# Patient Record
Sex: Female | Born: 2000 | Hispanic: Yes | Marital: Single | Smoking: Never smoker
Health system: Southern US, Community
[De-identification: ages and names within clinical notes are randomized; demographics above are authoritative.]

## PROBLEM LIST (undated history)

## (undated) DIAGNOSIS — N915 Oligomenorrhea, unspecified: Secondary | ICD-10-CM

## (undated) HISTORY — PX: NO PAST SURGERIES: SHX2092

## (undated) HISTORY — DX: Oligomenorrhea, unspecified: N91.5

---
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
## 2019-06-02 ENCOUNTER — Ambulatory Visit: Payer: Self-pay | Attending: Internal Medicine

## 2019-06-02 DIAGNOSIS — Z23 Encounter for immunization: Secondary | ICD-10-CM

## 2019-06-02 NOTE — Progress Notes (Signed)
   Covid-19 Vaccination Clinic  Name:  Nykeria Mealing    MRN: [id number] DOB: 2000/08/19  06/02/2019  Ms. Jnya Brossard was observed post Covid-19 immunization for 15 minutes without incident. She was provided with Vaccine Information Sheet and instruction to access the V-Safe system.   Ms. Malinda Mayden was instructed to call 911 with any severe reactions post vaccine: Marland Kitchen Difficulty breathing  . Swelling of face and throat  . A fast heartbeat  . A bad rash all over body  . Dizziness and weakness   Immunizations Administered    Name Date Dose VIS Date Route   Pfizer COVID-19 Vaccine 06/02/2019  6:54 PM 0.3 mL 02/15/2019 Intramuscular   Manufacturer: [employer], [employer]   Lot: [id number]   NDC: [address]-2

## 2019-06-23 ENCOUNTER — Ambulatory Visit: Payer: Self-pay | Attending: Internal Medicine

## 2019-06-23 DIAGNOSIS — Z23 Encounter for immunization: Secondary | ICD-10-CM

## 2019-06-23 NOTE — Progress Notes (Signed)
   Covid-19 Vaccination Clinic  Name:  Kristine Stanley    MRN: [id number] DOB: 2000/05/03  06/23/2019  Ms. Kristine Stanley was observed post Covid-19 immunization for 15 minutes without incident. She was provided with Vaccine Information Sheet and instruction to access the V-Safe system.   Ms. Kristine Stanley was instructed to call 911 with any severe reactions post vaccine: Marland Kitchen Difficulty breathing  . Swelling of face and throat  . A fast heartbeat  . A bad rash all over body  . Dizziness and weakness   Immunizations Administered    Name Date Dose VIS Date Route   Pfizer COVID-19 Vaccine 06/23/2019  5:56 PM 0.3 mL 02/15/2019 Intramuscular   Manufacturer: [employer], [employer]   Lot: [id number]   NDC: [address]-8

## 2019-07-11 ENCOUNTER — Emergency Department
Admission: EM | Admit: 2019-07-11 | Discharge: 2019-07-11 | Disposition: A | Discharge status: Home / Self Care | Payer: No Typology Code available for payment source | Attending: Emergency Medicine | Admitting: Emergency Medicine

## 2019-07-11 ENCOUNTER — Other Ambulatory Visit: Payer: Self-pay

## 2019-07-11 ENCOUNTER — Emergency Department: Payer: No Typology Code available for payment source

## 2019-07-11 DIAGNOSIS — G44201 Tension-type headache, unspecified, intractable: Secondary | ICD-10-CM | POA: Diagnosis not present

## 2019-07-11 DIAGNOSIS — Y999 Unspecified external cause status: Secondary | ICD-10-CM | POA: Insufficient documentation

## 2019-07-11 DIAGNOSIS — S161XXA Strain of muscle, fascia and tendon at neck level, initial encounter: Secondary | ICD-10-CM

## 2019-07-11 DIAGNOSIS — S199XXA Unspecified injury of neck, initial encounter: Secondary | ICD-10-CM | POA: Diagnosis present

## 2019-07-11 DIAGNOSIS — Y93I9 Activity, other involving external motion: Secondary | ICD-10-CM | POA: Diagnosis not present

## 2019-07-11 DIAGNOSIS — Y92410 Unspecified street and highway as the place of occurrence of the external cause: Secondary | ICD-10-CM | POA: Insufficient documentation

## 2019-07-11 MED ORDER — BUTALBITAL-APAP-CAFFEINE 50-325-40 MG PO TABS
2.0000 | ORAL_TABLET | Freq: Once | ORAL | Status: AC
  Administered 2019-07-11: 19:00:00 2 via ORAL
  Filled 2019-07-11: qty 2

## 2019-07-11 MED ORDER — CYCLOBENZAPRINE HCL 5 MG PO TABS
5.0000 mg | ORAL_TABLET | Freq: Three times a day (TID) | ORAL | 0 refills | Status: DC | PRN
Start: 2019-07-11 — End: 2023-04-04

## 2019-07-11 MED ORDER — NAPROXEN 500 MG PO TABS: 500.0000 mg | ORAL_TABLET | Freq: Two times a day (BID) | ORAL | 0 refills | Status: DC
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
## 2019-07-11 NOTE — Discharge Instructions (Signed)
Please follow-up with your primary care provider for symptoms that are not improving over the next week or so.  Return to the emergency department for symptoms of change or worsen if you are unable to schedule an appointment.
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
## 2023-03-06 ENCOUNTER — Ambulatory Visit: Payer: 59 | Admitting: Family Medicine

## 2023-04-03 ENCOUNTER — Ambulatory Visit: Payer: 59 | Admitting: Family Medicine

## 2023-04-04 ENCOUNTER — Encounter: Payer: Self-pay | Admitting: Family Medicine

## 2023-04-04 ENCOUNTER — Ambulatory Visit (INDEPENDENT_AMBULATORY_CARE_PROVIDER_SITE_OTHER): Payer: 59 | Admitting: Family Medicine

## 2023-04-04 VITALS — BP 100/65 | HR 64 | Ht 60.0 in | Wt 143.0 lb

## 2023-04-04 DIAGNOSIS — Z13220 Encounter for screening for lipoid disorders: Secondary | ICD-10-CM

## 2023-04-04 DIAGNOSIS — Z131 Encounter for screening for diabetes mellitus: Secondary | ICD-10-CM

## 2023-04-04 DIAGNOSIS — N913 Primary oligomenorrhea: Secondary | ICD-10-CM | POA: Insufficient documentation

## 2023-04-04 DIAGNOSIS — E663 Overweight: Secondary | ICD-10-CM

## 2023-04-04 DIAGNOSIS — Z130 Encounter for screening for diseases of the blood and blood-forming organs and certain disorders involving the immune mechanism: Secondary | ICD-10-CM | POA: Diagnosis not present

## 2023-04-04 DIAGNOSIS — Z7689 Persons encountering health services in other specified circumstances: Secondary | ICD-10-CM

## 2023-04-04 DIAGNOSIS — Z6827 Body mass index (BMI) 27.0-27.9, adult: Secondary | ICD-10-CM | POA: Insufficient documentation

## 2023-04-04 NOTE — Assessment & Plan Note (Signed)
Possible PCOS based on irregular menstrual cycles and history of ovarian cysts. Symptoms and treatment align with PCOS management. Discussed weight management and exercise. Explained metformin's role in addressing insulin resistance common in PCOS. - Continue metformin 500 mg daily with dinner - Continue Provera 10 mg once daily - Continue letrozole on days 3-7 of the menstrual cycle - Order CBC, thyroid panel, metabolic panel, A1c, testosterone, FSH, LH, and prolactin - f/u with GYN as possible

## 2023-04-04 NOTE — Assessment & Plan Note (Signed)
BMI 27.93 A1c, lipid panel, CMP

## 2023-04-04 NOTE — Progress Notes (Signed)
New patient visit    Patient: Kristine Stanley   DOB: Jan 31, 2001   23 y.o. Female  MRN: [id number] Visit Date: 04/04/2023  Today's healthcare provider: Ronnald Ramp, MD   Chief Complaint  Patient presents with   New Patient (Initial Visit)   Subjective    Kristine Stanley is a 23 y.o. female who presents today as a new patient to establish care.    Discussed the use of AI scribe software for clinical note transcription with the patient, who gave verbal consent to proceed.  History of Present Illness   The patient presents with concerns about possible PCOS and irregular menstruation.  She has never had regular menstrual periods and was informed by her OBGYN that she may have polycystic ovary syndrome (PCOS). Menstruation began at a very young age, around 23 years old, with ovarian cysts present at that time. She has never menstruated spontaneously and requires medication to induce her periods.  Currently, she is taking 500 mg of metformin daily with dinner to address potential insulin resistance associated with PCOS. Additionally, she is on Provera to stimulate menstruation and takes letrozole from days three to seven of her cycle. This is her first month on letrozole.  She has been trying to conceive since May 2024 without success and is concerned about her irregular periods and the possibility of PCOS affecting her fertility.  She has not had a primary care provider for a couple of years and is seeking to establish care. She has not had a physical examination in a while and is interested in having basic labs done to check her overall health, including thyroid function and anemia, as she experiences cold intolerance. Previous thyroid function tests from June 2024 showed normal TSH and T4 levels. Her last A1c was normal at 5.3.  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 She exercises by walking a mile most days of the  week and is trying to lose weight, as she is aware of the potential link between PCOS and insulin resistance.  No history of asthma, blood clots, migraines, or thyroid issues, although there was a consideration of hypothyroidism due to her cold intolerance.      Past Medical History:  Diagnosis Date   Oligomenorrhea    Off BC    Outpatient Medications Prior to Visit  Medication Sig   letrozole (FEMARA) 2.5 MG tablet Take by mouth.   medroxyPROGESTERone (PROVERA) 10 MG tablet Take by mouth.   metFORMIN (GLUCOPHAGE-XR) 500 MG 24 hr tablet Take 500 mg by mouth daily.   [DISCONTINUED] cyclobenzaprine (FLEXERIL) 5 MG tablet Take 1 tablet (5 mg total) by mouth 3 (three) times daily as needed for muscle spasms.   [DISCONTINUED] naproxen (NAPROSYN) 500 MG tablet Take 1 tablet (500 mg total) by mouth 2 (two) times daily with a meal.   [DISCONTINUED] norelgestromin-ethinyl estradiol [employer]) 150-35 MCG/24HR transdermal patch Place 1 patch onto the skin once a week. Apply 1 patch weekly for 3 weeks, then 1 week without patch   Facility-Administered Medications Prior to Visit  Medication Dose Route Frequency Provider   betamethasone acetate-betamethasone sodium phosphate (CELESTONE) injection 3 mg  3 mg Intramuscular Once Gala Lewandowsky M, DPM   betamethasone acetate-betamethasone sodium phosphate (CELESTONE) injection 3 mg  3 mg Intramuscular Once Felecia Shelling, DPM    Past Surgical History:  Procedure Laterality Date   NO PAST SURGERIES     Family Status  Relation Name  Status   Mother  Alive   Father  Alive  No partnership data on file   Family History  Problem Relation Age of Onset   Healthy Mother    Healthy Father    Social History   Socioeconomic History   Marital status: Single    Spouse name: Not on file   Number of children: Not on file   Years of education: Not on file   Highest education level: Not on file  Occupational History   Not on file  Tobacco Use   Smoking  status: Never   Smokeless tobacco: Never  Substance and Sexual Activity   Alcohol use: Yes    Comment: social   Drug use: No   Sexual activity: Yes    Birth control/protection: Condom  Other Topics Concern   Not on file  Social History Narrative   Not on file   Social Drivers of Health   Financial Resource Strain: Not on file  Food Insecurity: Not on file  Transportation Needs: Not on file  Physical Activity: Not on file  Stress: Not on file  Social Connections: Not on file     No Known Allergies  Immunization History  Administered Date(s) Administered   Influenza-Unspecified 12/15/2022   PFIZER(Purple Top)SARS-COV-2 Vaccination 06/02/2019, 06/23/2019    Health Maintenance  Topic Date Due   HPV VACCINES (1 - 3-dose series) Never done   HIV Screening  Never done   Hepatitis C Screening  Never done   CHLAMYDIA SCREENING  05/30/2017   DTaP/Tdap/Td (1 - Tdap) Never done   COVID-19 Vaccine (3 - 2024-25 season) 11/06/2022   Cervical Cancer Screening (Pap smear)  06/09/2025   INFLUENZA VACCINE  Completed    Patient Care Team: Ronnald Ramp, MD as PCP - General (Family Medicine)  Review of Systems  Last CBC No results found for: "WBC", "HGB", "HCT", "MCV", "MCH", "RDW", "PLT" Last metabolic panel No results found for: "GLUCOSE", "NA", "K", "CL", "CO2", "BUN", "CREATININE", "EGFR", "CALCIUM", "PHOS", "PROT", "ALBUMIN", "LABGLOB", "AGRATIO", "BILITOT", "ALKPHOS", "AST", "ALT", "ANIONGAP" Last lipids No results found for: "CHOL", "HDL", "LDLCALC", "LDLDIRECT", "TRIG", "CHOLHDL" Last hemoglobin A1c No results found for: "HGBA1C" Last thyroid functions No results found for: "TSH", "T3TOTAL", "T4TOTAL", "THYROIDAB"      Objective    BP 100/65 (BP Location: Left Arm, Patient Position: Sitting, Cuff Size: Normal)   Pulse 64   Ht 5' (1.524 m)   Wt 143 lb (64.9 kg)   LMP 08/25/2022   SpO2 97%   BMI 27.93 kg/m  BP Readings from Last 3 Encounters:   04/04/23 100/65  07/11/19 127/89  01/26/17 119/64   Wt Readings from Last 3 Encounters:  04/04/23 143 lb (64.9 kg)  07/11/19 135 lb (61.2 kg)  01/26/17 135 lb (61.2 kg) (67%, Z= 0.45)*   * Growth percentiles are based on CDC (Girls, 2-20 Years) data.        Depression Screen    04/04/2023    9:39 AM  PHQ 2/9 Scores  PHQ - 2 Score 0  PHQ- 9 Score 0   No results found for any visits on 04/04/23.   Physical Exam Vitals reviewed.  Constitutional:      General: She is not in acute distress.    Appearance: Normal appearance. She is not ill-appearing, toxic-appearing or diaphoretic.  Eyes:     Conjunctiva/sclera: Conjunctivae normal.  Neck:     Thyroid: No thyroid mass, thyromegaly or thyroid tenderness.  Cardiovascular:     Rate and  Rhythm: Normal rate and regular rhythm.     Pulses: Normal pulses.     Heart sounds: Normal heart sounds. No murmur heard.    No friction rub. No gallop.  Pulmonary:     Effort: Pulmonary effort is normal. No respiratory distress.     Breath sounds: Normal breath sounds. No stridor. No wheezing, rhonchi or rales.  Abdominal:     General: Bowel sounds are normal. There is no distension.     Palpations: Abdomen is soft.     Tenderness: There is no abdominal tenderness.  Musculoskeletal:     Right lower leg: No edema.     Left lower leg: No edema.  Skin:    Findings: No erythema or rash.  Neurological:     Mental Status: She is alert and oriented to person, place, and time.        Assessment & Plan      Problem List Items Addressed This Visit       Other   Primary oligomenorrhea - Primary   Possible PCOS based on irregular menstrual cycles and history of ovarian cysts. Symptoms and treatment align with PCOS management. Discussed weight management and exercise. Explained metformin's role in addressing insulin resistance common in PCOS. - Continue metformin 500 mg daily with dinner - Continue Provera 10 mg once daily - Continue  letrozole on days 3-7 of the menstrual cycle - Order CBC, thyroid panel, metabolic panel, A1c, testosterone, FSH, LH, and prolactin - f/u with GYN as possible       Relevant Orders   Testosterone,Free and Total   FSH/LH   Prolactin   Overweight with body mass index (BMI) of 27 to 27.9 in adult   BMI 27.93 A1c, lipid panel, CMP       Relevant Orders   Hemoglobin A1c   CMP14+EGFR   TSH+T4F+T3Free   Lipid panel   Other Visit Diagnoses       Establishing care with new doctor, encounter for         Screening for deficiency anemia       Relevant Orders   CBC     Screening for lipid disorders         Screening for diabetes mellitus          General Health Maintenance Has not had a physical exam in several years and has not established care with a primary care provider. Discussed the importance of annual physical exams, routine screenings, and vaccinations. Encouraged taking a prenatal vitamin to support conception efforts. - Schedule annual physical exam for end of June - Discuss and update vaccinations and screenings during physical exam - Encourage taking a prenatal vitamin - Use MyChart for communication and results  Follow-up - Schedule follow-up appointment for physical exam in end of June - Review lab results and communicate via MyChart - Follow up with OBGYN in a couple of months.       Return in about 5 months (around 09/02/2023) for CPE.      Ronnald Ramp, MD  [facility] [phone] (phone) [phone] (fax)  [facility]

## 2023-04-05 ENCOUNTER — Encounter: Payer: Self-pay | Admitting: Family Medicine

## 2023-04-06 LAB — CMP14+EGFR
ALT: 14 [IU]/L (ref 0–32)
AST: 18 [IU]/L (ref 0–40)
Albumin: 4.5 g/dL (ref 4.0–5.0)
Alkaline Phosphatase: 109 [IU]/L (ref 44–121)
BUN/Creatinine Ratio: 19 (ref 9–23)
BUN: 10 mg/dL (ref 6–20)
Bilirubin Total: 0.5 mg/dL (ref 0.0–1.2)
CO2: 24 mmol/L (ref 20–29)
Calcium: 9.5 mg/dL (ref 8.7–10.2)
Chloride: 101 mmol/L (ref 96–106)
Creatinine, Ser: 0.53 mg/dL — ABNORMAL LOW (ref 0.57–1.00)
Globulin, Total: 2.8 g/dL (ref 1.5–4.5)
Glucose: 95 mg/dL (ref 70–99)
Potassium: 4.2 mmol/L (ref 3.5–5.2)
Sodium: 139 mmol/L (ref 134–144)
Total Protein: 7.3 g/dL (ref 6.0–8.5)
eGFR: 132 mL/min/{1.73_m2} (ref >59)

## 2023-04-06 LAB — CBC
Hematocrit: 39.1 % (ref 34.0–46.6)
Hemoglobin: 12.8 g/dL (ref 11.1–15.9)
MCH: 27.1 pg (ref 26.6–33.0)
MCHC: 32.7 g/dL (ref 31.5–35.7)
MCV: 83 fL (ref 79–97)
Platelets: 272 10*3/uL (ref 150–450)
RBC: 4.73 x10E6/uL (ref 3.77–5.28)
RDW: 13.5 % (ref 11.7–15.4)
WBC: 7.6 10*3/uL (ref 3.4–10.8)

## 2023-04-06 LAB — TESTOSTERONE,FREE AND TOTAL
Testosterone, Free: 2.9 pg/mL (ref 0.0–4.2)
Testosterone: 52 ng/dL (ref 13–71)

## 2023-04-06 LAB — LIPID PANEL
Chol/HDL Ratio: 3.1 {ratio} (ref 0.0–4.4)
Cholesterol, Total: 162 mg/dL (ref 100–199)
HDL: 52 mg/dL (ref >39)
LDL Chol Calc (NIH): 96 mg/dL (ref 0–99)
Triglycerides: 74 mg/dL (ref 0–149)
VLDL Cholesterol Cal: 14 mg/dL (ref 5–40)

## 2023-04-06 LAB — HEMOGLOBIN A1C
Est. average glucose Bld gHb Est-mCnc: 108 mg/dL
Hgb A1c MFr Bld: 5.4 % (ref 4.8–5.6)

## 2023-04-06 LAB — TSH+T4F+T3FREE
Free T4: 1.37 ng/dL (ref 0.82–1.77)
T3, Free: 3.6 pg/mL (ref 2.0–4.4)
TSH: 0.762 u[IU]/mL (ref 0.450–4.500)

## 2023-04-06 LAB — FSH/LH
FSH: 4.5 m[IU]/mL
LH: 6.5 m[IU]/mL

## 2023-04-06 LAB — PROLACTIN: Prolactin: 8.9 ng/mL (ref 4.8–33.4)

## 2023-04-28 ENCOUNTER — Ambulatory Visit: Payer: 59 | Admitting: Family Medicine

## 2023-04-28 ENCOUNTER — Encounter: Payer: Self-pay | Admitting: Family Medicine

## 2023-04-28 VITALS — BP 106/65 | HR 67 | Ht 60.0 in | Wt 146.0 lb

## 2023-04-28 DIAGNOSIS — N3000 Acute cystitis without hematuria: Secondary | ICD-10-CM

## 2023-04-28 DIAGNOSIS — R300 Dysuria: Secondary | ICD-10-CM | POA: Diagnosis not present

## 2023-04-28 LAB — POCT URINALYSIS DIPSTICK (MANUAL)
Nitrite, UA: NEGATIVE
Poct Bilirubin: NEGATIVE
Poct Blood: 250 — AB
Poct Glucose: NORMAL mg/dL
Poct Ketones: NEGATIVE
Poct Protein: NEGATIVE mg/dL
Poct Urobilinogen: NORMAL mg/dL
Spec Grav, UA: 1.01 (ref 1.010–1.025)
pH, UA: 7 (ref 5.0–8.0)

## 2023-04-28 MED ORDER — CEPHALEXIN 500 MG PO CAPS: 500.0000 mg | ORAL_CAPSULE | Freq: Three times a day (TID) | ORAL | 0 refills | Status: AC

## 2023-04-28 NOTE — Progress Notes (Signed)
Established patient visit   Patient: Kristine Stanley   DOB: 10-19-2000   23 y.o. Female  MRN: [id number] Visit Date: 04/28/2023  Today's healthcare provider: Ronnald Ramp, MD   Chief Complaint  Patient presents with   Urinary Frequency   Back Pain    Started yesterday, denies injury   Subjective     HPI     Back Pain    Additional comments: Started yesterday, denies injury      Last edited by Judd Gaudier, CMA on 04/28/2023  8:42 AM.       Discussed the use of AI scribe software for clinical note transcription with the patient, who gave verbal consent to proceed.  History of Present Illness   Kristine Stanley is a 23 year old female who presents with dysuria and urinary frequency.  She has been experiencing dysuria and urinary frequency since yesterday. The pain occurs during urination, and she feels unable to completely empty her bladder. Despite drinking water throughout the day, she continues to feel the urge to urinate frequently.  She started her menstrual period yesterday and initially thought the symptoms might be related to menstrual cramps. However, the urinary symptoms persisted throughout the day.  No fever, chills, nausea, vomiting, diarrhea, or abnormal urine color. She reports mild lower back pain, which she describes as not severe.      Past Medical History:  Diagnosis Date   Oligomenorrhea    Off BC    Medications: Outpatient Medications Prior to Visit  Medication Sig   letrozole (FEMARA) 2.5 MG tablet Take by mouth.   medroxyPROGESTERone (PROVERA) 10 MG tablet Take by mouth.   metFORMIN (GLUCOPHAGE-XR) 500 MG 24 hr tablet Take 500 mg by mouth daily.   [DISCONTINUED] betamethasone acetate-betamethasone sodium phosphate (CELESTONE) injection 3 mg    [DISCONTINUED] betamethasone acetate-betamethasone sodium phosphate (CELESTONE) injection 3 mg    No facility-administered medications prior to visit.    Review of  Systems  Last metabolic panel Lab Results  Component Value Date   GLUCOSE 95 04/04/2023   NA 139 04/04/2023   K 4.2 04/04/2023   CL 101 04/04/2023   CO2 24 04/04/2023   BUN 10 04/04/2023   CREATININE 0.53 (L) 04/04/2023   EGFR 132 04/04/2023   CALCIUM 9.5 04/04/2023   PROT 7.3 04/04/2023   ALBUMIN 4.5 04/04/2023   LABGLOB 2.8 04/04/2023   BILITOT 0.5 04/04/2023   ALKPHOS 109 04/04/2023   AST 18 04/04/2023   ALT 14 04/04/2023        Objective    BP 106/65   Pulse 67   Ht 5' (1.524 m)   Wt 146 lb (66.2 kg)   LMP 04/27/2023   BMI 28.51 kg/m  BP Readings from Last 3 Encounters:  04/28/23 106/65  04/04/23 100/65  07/11/19 127/89   Wt Readings from Last 3 Encounters:  04/28/23 146 lb (66.2 kg)  04/04/23 143 lb (64.9 kg)  07/11/19 135 lb (61.2 kg)        Physical Exam  Physical Exam   ABDOMEN: no CVA tenderness, suprapubic tenderness, no abdominal distention, no masses palpated, normal BS       Results for orders placed or performed in visit on 04/28/23  POCT Urinalysis Dip Manual  Result Value Ref Range   Spec Grav, UA 1.010 1.010 - 1.025   pH, UA 7.0 5.0 - 8.0   Leukocytes, UA Small (1+) (A) Negative   Nitrite, UA Negative Negative  Poct Protein Negative Negative, trace mg/dL   Poct Glucose Normal Normal mg/dL   Poct Ketones Negative Negative   Poct Urobilinogen Normal Normal mg/dL   Poct Bilirubin Negative Negative   Poct Blood =250 (A) Negative, trace    Assessment & Plan     Problem List Items Addressed This Visit   None Visit Diagnoses       Acute cystitis without hematuria    -  Primary   Relevant Medications   cephALEXin (KEFLEX) 500 MG capsule     Dysuria       Relevant Orders   Urine Culture   POCT Urinalysis Dip Manual (Completed)           Acute Cystitis Presents with dysuria, urinary frequency, and lower abdominal pain since yesterday. Symptoms include a sensation of incomplete bladder emptying and mild lower back pain. No  fever, chills, nausea, vomiting, or abnormal urine color reported. Physical exam reveals lower abdominal tenderness. Differential diagnosis includes acute cystitis or bladder infection. Given the symptoms and physical findings, acute cystitis is the most likely diagnosis. Discussed risks of untreated infection, including progression to pyelonephritis. Benefits of antibiotic treatment include symptom relief and prevention of complications. No significant drug interactions with current medications (Provera and letrozole). - Order urine study and culture - Prescribe Keflex 500 mg TID for 7 days - Advise increased water intake - Instruct to seek urgent care if experiencing fever, chills, inability to eat or drink, severe nausea, or diarrhea - Send prescription to [facility] - Review urine culture results next week and adjust antibiotics if necessary  General Health Maintenance Currently taking Provera to induce menstruation and will start letrozole tomorrow. No interactions between prescribed antibiotics and current medications were found. - Verify no drug interactions between Keflex and current medications (Provera and letrozole)         No follow-ups on file.         Ronnald Ramp, MD  [facility] [phone] (phone) [phone] (fax)  [facility]

## 2023-05-25 ENCOUNTER — Encounter: Payer: Self-pay | Admitting: Family Medicine

## 2023-05-25 ENCOUNTER — Telehealth: Payer: Self-pay | Admitting: Family Medicine

## 2023-05-25 ENCOUNTER — Ambulatory Visit: Admitting: Family Medicine

## 2023-05-25 ENCOUNTER — Ambulatory Visit
Admission: RE | Admit: 2023-05-25 | Discharge: 2023-05-25 | Disposition: A | Discharge status: Home / Self Care | Source: Ambulatory Visit | Attending: Family Medicine | Admitting: Family Medicine

## 2023-05-25 VITALS — BP 115/70 | HR 84 | Ht 60.0 in | Wt 145.0 lb

## 2023-05-25 DIAGNOSIS — O26899 Other specified pregnancy related conditions, unspecified trimester: Secondary | ICD-10-CM | POA: Insufficient documentation

## 2023-05-25 DIAGNOSIS — R109 Unspecified abdominal pain: Secondary | ICD-10-CM

## 2023-05-25 DIAGNOSIS — Z3201 Encounter for pregnancy test, result positive: Secondary | ICD-10-CM | POA: Diagnosis present

## 2023-05-25 MED ORDER — COMPLETENATE 29-1 MG PO CHEW: 1.0000 | CHEWABLE_TABLET | Freq: Every day | ORAL | 2 refills | Status: DC

## 2023-05-25 NOTE — Progress Notes (Signed)
 Established patient visit   Patient: Kristine Stanley   DOB: 2000/12/25   22 y.o. Female  MRN: [id number] Visit Date: 05/25/2023  Today's healthcare provider: Ronnald Ramp, MD   Chief Complaint  Patient presents with   Dysmenorrhea    Abdominal cramping and lower back pain she is about [redacted] weeks pregnant she has an OB appt 4/21 she would also like to discuss her medication    Subjective     HPI     Dysmenorrhea    Additional comments: Abdominal cramping and lower back pain she is about [redacted] weeks pregnant she has an OB appt 4/21 she would also like to discuss her medication       Last edited by Thedora Hinders, CMA on 05/25/2023  8:45 AM.       Discussed the use of AI scribe software for clinical note transcription with the patient, who gave verbal consent to proceed.  History of Present Illness Kristine Stanley is a 23 year old female who presents with early pregnancy symptoms and concerns about cramping and back pain.  She discovered her pregnancy after experiencing unusual symptoms such as increased burping and back pain. A home pregnancy test confirmed the pregnancy. Her last menstrual period started on February 20th, and she had taken letrozole and Provera in February due to irregular periods, which contributed to her decision to take a pregnancy test.  She describes cramping and back pain, particularly when walking, which began intensifying a couple of days ago. No vaginal bleeding or discharge is noted. The cramping is mostly noticeable when she is active, but it is not severe enough to impede her daily activities. She has not experienced breast tenderness or any other significant symptoms aside from the cramping and back pain.  She has been taking a prenatal vitamin and took Tylenol for the back pain and cramping, which provided some relief.     Past Medical History:  Diagnosis Date   Oligomenorrhea    Off BC    Medications: Outpatient  Medications Prior to Visit  Medication Sig Note   [DISCONTINUED] metFORMIN (GLUCOPHAGE-XR) 500 MG 24 hr tablet Take 500 mg by mouth daily. 05/25/2023: pregnancy   letrozole (FEMARA) 2.5 MG tablet Take by mouth. (Patient not taking: Reported on 05/25/2023)    medroxyPROGESTERone (PROVERA) 10 MG tablet Take by mouth. (Patient not taking: Reported on 05/25/2023)    No facility-administered medications prior to visit.    Review of Systems  Last CBC Lab Results  Component Value Date   WBC 7.6 04/04/2023   HGB 12.8 04/04/2023   HCT 39.1 04/04/2023   MCV 83 04/04/2023   MCH 27.1 04/04/2023   RDW 13.5 04/04/2023   PLT 272 04/04/2023   Last metabolic panel Lab Results  Component Value Date   GLUCOSE 95 04/04/2023   NA 139 04/04/2023   K 4.2 04/04/2023   CL 101 04/04/2023   CO2 24 04/04/2023   BUN 10 04/04/2023   CREATININE 0.53 (L) 04/04/2023   EGFR 132 04/04/2023   CALCIUM 9.5 04/04/2023   PROT 7.3 04/04/2023   ALBUMIN 4.5 04/04/2023   LABGLOB 2.8 04/04/2023   BILITOT 0.5 04/04/2023   ALKPHOS 109 04/04/2023   AST 18 04/04/2023   ALT 14 04/04/2023   Last lipids Lab Results  Component Value Date   CHOL 162 04/04/2023   HDL 52 04/04/2023   LDLCALC 96 04/04/2023   TRIG 74 04/04/2023   CHOLHDL 3.1 04/04/2023  Objective    BP 115/70   Pulse 84   Ht 5' (1.524 m)   Wt 145 lb (65.8 kg)   LMP 04/27/2023   SpO2 100%   BMI 28.32 kg/m  BP Readings from Last 3 Encounters:  05/25/23 115/70  04/28/23 106/65  04/04/23 100/65   Wt Readings from Last 3 Encounters:  05/25/23 145 lb (65.8 kg)  04/28/23 146 lb (66.2 kg)  04/04/23 143 lb (64.9 kg)        Physical Exam  Physical Exam GENERAL: No acute distress. ABDOMEN: No abdominal distention, suprapubic tenderness present. Abdomen soft, normal bowel sounds. No costovertebral angle tenderness. GENITOURINARY: No adnexal tenderness. MUSCULOSKELETAL: Bilateral lumbar tenderness, no midline lumbar pain.    No  results found for any visits on 05/25/23.  Assessment & Plan     Problem List Items Addressed This Visit   None Visit Diagnoses       Positive pregnancy test    -  Primary   Relevant Medications   prenatal vitamin w/FE, FA (NATACHEW) 29-1 MG CHEW chewable tablet   Other Relevant Orders   Beta HCG, Quant   US OB Transvaginal (Completed)     Abdominal cramping affecting pregnancy       Relevant Orders   US OB Transvaginal (Completed)       Assessment & Plan Early Pregnancy Confirmed by a positive home pregnancy test. She reports cramping and back pain, common in early pregnancy as the uterus adjusts. No vaginal bleeding or discharge. Further evaluation is warranted due to symptoms. - Order beta HCG quant test to assess pregnancy hormone levels - Order transvaginal ultrasound to evaluate early pregnancy and associated symptoms - Advise to attend prenatal appointment on April 21 - Prescribe prenatal vitamin with iron - Provide list of acceptable over-the-counter medications during pregnancy - Advise against taking metformin, letrozole, Motrin, and ibuprofen - Recommend Tylenol for pain management - Advise gentle exercise and avoid high-impact activities     No follow-ups on file.         Ronnald Ramp, MD  [facility] [phone] (phone) [phone] (fax)  [facility]

## 2023-05-25 NOTE — Telephone Encounter (Signed)
 Copied from CRM [id number]. Topic: Clinical - Red Word Triage >> May 25, 2023 11:37 AM Franchot Heidelberg wrote: Red Word that prompted transfer to Nurse Triage: Stat US Report  [facility] Ultrasound calling from [facility] but they were able to be transferred to [facility] at [facility] successfully by the agent prior to this RN speaking with them.  No further action needed from this RN.

## 2023-05-25 NOTE — Telephone Encounter (Signed)
 Copied from CRM [id number]. Topic: Clinical - Lab/Test Results >> May 25, 2023 11:30 AM Nyra Capes wrote: Reason for CRM: [place] for [facility] has urgent results for [place]

## 2023-05-25 NOTE — Patient Instructions (Signed)
 [id number] Scheduling for ultrasound

## 2023-05-26 ENCOUNTER — Encounter: Payer: Self-pay | Admitting: Family Medicine

## 2023-05-26 LAB — BETA HCG QUANT (REF LAB): hCG Quant: 225 m[IU]/mL

## 2023-06-01 ENCOUNTER — Other Ambulatory Visit: Payer: Self-pay

## 2023-06-01 DIAGNOSIS — Z3201 Encounter for pregnancy test, result positive: Secondary | ICD-10-CM

## 2023-06-03 LAB — BETA HCG QUANT (REF LAB): hCG Quant: 4177 m[IU]/mL

## 2023-06-05 ENCOUNTER — Encounter: Payer: Self-pay | Admitting: Family Medicine

## 2023-06-27 DIAGNOSIS — Z2839 Other underimmunization status: Secondary | ICD-10-CM | POA: Insufficient documentation

## 2023-07-21 DIAGNOSIS — Z3491 Encounter for supervision of normal pregnancy, unspecified, first trimester: Secondary | ICD-10-CM | POA: Insufficient documentation

## 2023-07-21 DIAGNOSIS — E282 Polycystic ovarian syndrome: Secondary | ICD-10-CM | POA: Insufficient documentation

## 2023-08-09 ENCOUNTER — Ambulatory Visit (INDEPENDENT_AMBULATORY_CARE_PROVIDER_SITE_OTHER): Payer: Self-pay | Admitting: Family Medicine

## 2023-08-09 ENCOUNTER — Encounter: Payer: Self-pay | Admitting: Family Medicine

## 2023-08-09 VITALS — BP 99/59 | HR 76 | Ht 60.0 in | Wt 145.6 lb

## 2023-08-09 DIAGNOSIS — Z0000 Encounter for general adult medical examination without abnormal findings: Secondary | ICD-10-CM | POA: Diagnosis not present

## 2023-08-09 DIAGNOSIS — Z3A14 14 weeks gestation of pregnancy: Secondary | ICD-10-CM | POA: Diagnosis not present

## 2023-08-09 NOTE — Assessment & Plan Note (Signed)
 General Health Maintenance Maintains a well-balanced diet and exercises regularly, walking three days a week for 30-45 minutes. Up to date on hepatitis screening. HPV vaccination is not recommended during pregnancy. - Continue well-balanced diet and exercise as tolerated - Schedule repeat physical in one year

## 2023-08-09 NOTE — Progress Notes (Signed)
 Complete physical exam   Patient: Kristine Stanley   DOB: 08-24-2000   23 y.o. Female  MRN: [id number] Visit Date: 08/09/2023  Today's healthcare provider: Mimi Alt, MD   Chief Complaint  Patient presents with   Annual Exam    Diet -  General, well balanced when possible due to nausea to the point she can't eat or drink water the last few weeks Exercise - walking at least 3 days a week for 30-45 minutes at the park Feeling - pretty good Sleeping - fairly well due to waking up throughout the night Concerns -  not sure if not gaining weight due to pregnancy is a concern. Just some questions of what she should be experiencing    Care Management    HPV Vaccines - Hepatitis C Screening -    Subjective    Kristine Stanley is a 23 y.o. female who presents today for a complete physical exam.     Discussed the use of AI scribe software for clinical note transcription with the patient, who gave verbal consent to proceed.  History of Present Illness Kristine Stanley is a 23 year old female who presents for an annual physical exam during her pregnancy.  She is currently 14 weeks and 6 days pregnant and experiences severe nausea with episodes of vomiting occurring every other day. She manages her symptoms with mints and candies. She has not reported daily vomiting.  Her eating patterns have changed due to nausea, with fluid aversions, particularly to water. She attempts to stay hydrated with foods like watermelon and cucumbers and finds that adding lemon to her water helps. Coconut water is not preferred.  She engages in physical activity by walking three days a week for 30 to 45 minutes. She reports occasional sleep apnea, for which she has taken Tylenol . She is up to date on hepatitis screening and has not received the HPV vaccine.     Past Medical History:  Diagnosis Date   Oligomenorrhea    Off BC   Past Surgical History:  Procedure Laterality Date    NO PAST SURGERIES     Social History   Socioeconomic History   Marital status: Single    Spouse name: Not on file   Number of children: Not on file   Years of education: Not on file   Highest education level: Not on file  Occupational History   Not on file  Tobacco Use   Smoking status: Never   Smokeless tobacco: Never  Substance and Sexual Activity   Alcohol use: Yes    Comment: social   Drug use: No   Sexual activity: Yes    Birth control/protection: Condom  Other Topics Concern   Not on file  Social History Narrative   Not on file   Social Drivers of Health   Financial Resource Strain: Low Risk  (06/25/2023)   Received from [facility]   Overall Financial Resource Strain (CARDIA)    Difficulty of Paying Living Expenses: Not hard at all  Food Insecurity: No Food Insecurity (06/25/2023)   Received from [facility]   Hunger Vital Sign    Worried About Running Out of Food in the Last Year: Never true    Ran Out of Food in the Last Year: Never true  Transportation Needs: No Transportation Needs (06/25/2023)   Received from [facility] - Transportation    In the past 12 months,  has lack of transportation kept you from medical appointments or from getting medications?: No    Lack of Transportation (Non-Medical): No  Physical Activity: Not on file  Stress: No Stress Concern Present (05/25/2023)   [employer] [facility] [employer] [facility] - Occupational Stress Questionnaire    Feeling of Stress : Not at all  Social Connections: Not on file  Intimate Partner Violence: Not At Risk (05/25/2023)   Humiliation, Afraid, Rape, and Kick questionnaire    Fear of Current or Ex-Partner: No    Emotionally Abused: No    Physically Abused: No    Sexually Abused: No   Family Status  Relation Name Status   Mother  Alive   Father  Alive  No partnership data on file   Family History  Problem Relation Age of Onset    Healthy Mother    Healthy Father    No Known Allergies   Medications: Outpatient Medications Prior to Visit  Medication Sig   Ferrous Sulfate (IRON PO) Take 1 tablet by mouth.   prenatal vitamin w/FE, FA (NATACHEW) 29-1 MG CHEW chewable tablet Chew 1 tablet by mouth daily at 12 noon.   [DISCONTINUED] letrozole (FEMARA) 2.5 MG tablet Take by mouth. (Patient not taking: Reported on 08/09/2023)   [DISCONTINUED] medroxyPROGESTERone (PROVERA) 10 MG tablet Take by mouth. (Patient not taking: Reported on 08/09/2023)   No facility-administered medications prior to visit.    Review of Systems  Last CBC Lab Results  Component Value Date   WBC 7.6 04/04/2023   HGB 12.8 04/04/2023   HCT 39.1 04/04/2023   MCV 83 04/04/2023   MCH 27.1 04/04/2023   RDW 13.5 04/04/2023   PLT 272 04/04/2023   Last metabolic panel Lab Results  Component Value Date   GLUCOSE 95 04/04/2023   NA 139 04/04/2023   K 4.2 04/04/2023   CL 101 04/04/2023   CO2 24 04/04/2023   BUN 10 04/04/2023   CREATININE 0.53 (L) 04/04/2023   EGFR 132 04/04/2023   CALCIUM 9.5 04/04/2023   PROT 7.3 04/04/2023   ALBUMIN 4.5 04/04/2023   LABGLOB 2.8 04/04/2023   BILITOT 0.5 04/04/2023   ALKPHOS 109 04/04/2023   AST 18 04/04/2023   ALT 14 04/04/2023   Last lipids Lab Results  Component Value Date   CHOL 162 04/04/2023   HDL 52 04/04/2023   LDLCALC 96 04/04/2023   TRIG 74 04/04/2023   CHOLHDL 3.1 04/04/2023   Last hemoglobin A1c Lab Results  Component Value Date   HGBA1C 5.4 04/04/2023   Last thyroid functions Lab Results  Component Value Date   TSH 0.762 04/04/2023   Last vitamin D No results found for: "25OHVITD2", "25OHVITD3", "VD25OH" Last vitamin B12 and Folate No results found for: "VITAMINB12", "FOLATE"     Objective    BP (!) 99/59 (BP Location: Left Arm, Patient Position: Sitting, Cuff Size: Normal)   Pulse 76   Ht 5' (1.524 m)   Wt 145 lb 9.6 oz (66 kg)   LMP 04/27/2023   SpO2 100%   BMI  28.44 kg/m  BP Readings from Last 3 Encounters:  08/09/23 (!) 99/59  05/25/23 115/70  04/28/23 106/65   Wt Readings from Last 3 Encounters:  08/09/23 145 lb 9.6 oz (66 kg)  05/25/23 145 lb (65.8 kg)  04/28/23 146 lb (66.2 kg)        Physical Exam Vitals reviewed.  Constitutional:      General: She is not in acute distress.    Appearance:  Normal appearance. She is not ill-appearing, toxic-appearing or diaphoretic.  HENT:     Head: Normocephalic and atraumatic.     Right Ear: Tympanic membrane and external ear normal. There is no impacted cerumen.     Left Ear: Tympanic membrane and external ear normal. There is no impacted cerumen.     Nose: Nose normal.     Mouth/Throat:     Pharynx: Oropharynx is clear.  Eyes:     General: No scleral icterus.    Extraocular Movements: Extraocular movements intact.     Conjunctiva/sclera: Conjunctivae normal.     Pupils: Pupils are equal, round, and reactive to light.  Cardiovascular:     Rate and Rhythm: Normal rate and regular rhythm.     Pulses: Normal pulses.     Heart sounds: Normal heart sounds. No murmur heard.    No friction rub. No gallop.  Pulmonary:     Effort: Pulmonary effort is normal. No respiratory distress.     Breath sounds: Normal breath sounds. No wheezing, rhonchi or rales.  Abdominal:     General: Bowel sounds are normal. There is no distension.     Palpations: Abdomen is soft. There is no mass.     Tenderness: There is no abdominal tenderness. There is no guarding.  Musculoskeletal:        General: No deformity.     Cervical back: Normal range of motion and neck supple.     Right lower leg: No edema.     Left lower leg: No edema.  Lymphadenopathy:     Cervical: No cervical adenopathy.  Skin:    General: Skin is warm.     Capillary Refill: Capillary refill takes less than 2 seconds.     Findings: No erythema or rash.  Neurological:     General: No focal deficit present.     Mental Status: She is alert  and oriented to person, place, and time.     Cranial Nerves: Cranial nerves 2-12 are intact. No cranial nerve deficit or facial asymmetry.     Motor: Motor function is intact. No weakness.     Gait: Gait normal.  Psychiatric:        Mood and Affect: Mood normal.        Behavior: Behavior normal.       Last depression screening scores    08/09/2023   10:11 AM 04/28/2023    8:43 AM 04/04/2023    9:39 AM  PHQ 2/9 Scores  PHQ - 2 Score 0 0 0  PHQ- 9 Score   0    Last fall risk screening    08/09/2023   10:11 AM  Fall Risk   Falls in the past year? 0  Number falls in past yr: 0  Injury with Fall? 0  Risk for fall due to : No Fall Risks  Follow up Falls evaluation completed    Last Audit-C alcohol use screening    05/25/2023    8:43 AM  Alcohol Use Disorder Test (AUDIT)  1. How often do you have a drink containing alcohol? 0  2. How many drinks containing alcohol do you have on a typical day when you are drinking? 0  3. How often do you have six or more drinks on one occasion? 0  AUDIT-C Score 0   A score of 3 or more in women, and 4 or more in men indicates increased risk for alcohol abuse, EXCEPT if all of the points are from question  1   No results found for any visits on 08/09/23.  Assessment & Plan    Routine Health Maintenance and Physical Exam  Immunization History  Administered Date(s) Administered   DTaP 10/19/1998, 12/17/1998, 02/23/1999, 02/24/2000   Dtap, Unspecified 08/21/2002   HIB, Unspecified 10/19/1998, 12/17/1998, 02/23/1999, 11/24/1999   Hep A, Unspecified 08/17/2016   Hep B, Unspecified 11-16-98, 05/19/1999, 08/28/1999   Hepatitis B, ADULT 10/30/2019   IPV 10/19/1998, 12/17/1998, 02/24/2000   Influenza Inj Mdck Quad Pf 11/29/2021   Influenza-Unspecified 03/11/2005, 12/15/2022   MMR 08/28/1999, 08/21/2002   Meningococcal Acwy, Unspecified 04/19/2016   PFIZER(Purple Top)SARS-COV-2 Vaccination 06/02/2019, 06/23/2019   PPD Test 09/02/2021    Pneumococcal Conjugate PCV 7 02/23/1999, 05/19/1999, 08/28/1999, 11/24/1999   Polio, Unspecified 08/21/2002   Tdap 08/25/2009, 10/30/2019   Varicella 08/28/1999, 08/09/2006    Health Maintenance  Topic Date Due   HPV VACCINES (1 - 3-dose series) Never done   Hepatitis C Screening  Never done   COVID-19 Vaccine (3 - 2024-25 season) 11/06/2022   INFLUENZA VACCINE  10/06/2023   CHLAMYDIA SCREENING  07/19/2024   Cervical Cancer Screening (Pap smear)  06/09/2025   DTaP/Tdap/Td (8 - Td or Tdap) 10/29/2029   HIV Screening  Completed   Meningococcal B Vaccine  Aged Out    Problem List Items Addressed This Visit       Other   Annual physical exam - Primary   General Health Maintenance Maintains a well-balanced diet and exercises regularly, walking three days a week for 30-45 minutes. Up to date on hepatitis screening. HPV vaccination is not recommended during pregnancy. - Continue well-balanced diet and exercise as tolerated - Schedule repeat physical in one year      Other Visit Diagnoses       [redacted] weeks gestation of pregnancy            Assessment & Plan Pregnancy Currently 14 weeks and 6 days pregnant with nausea and occasional vomiting, which is improving. Early fetal DNA testing confirms a female fetus. Following up with obstetrician for prenatal care. - Continue prenatal care with obstetrician - Use over-the-counter options like Unisom and ginger tablets for nausea - If nausea and vomiting persist beyond 15-16 weeks, consult obstetrician for further management         Return in about 1 year (around 08/08/2024) for CPE.       Mimi Alt, MD  [facility] [phone] (phone) [phone] (fax)  [facility]

## 2024-02-08 ENCOUNTER — Ambulatory Visit: Admitting: Physician Assistant

## 2024-02-08 ENCOUNTER — Encounter: Payer: Self-pay | Admitting: Physician Assistant

## 2024-02-08 ENCOUNTER — Ambulatory Visit: Payer: Self-pay

## 2024-02-08 VITALS — BP 112/84 | HR 90 | Ht 60.0 in | Wt 165.7 lb

## 2024-02-08 DIAGNOSIS — Z789 Other specified health status: Secondary | ICD-10-CM | POA: Insufficient documentation

## 2024-02-08 DIAGNOSIS — R350 Frequency of micturition: Secondary | ICD-10-CM | POA: Insufficient documentation

## 2024-02-08 DIAGNOSIS — R3989 Other symptoms and signs involving the genitourinary system: Secondary | ICD-10-CM | POA: Insufficient documentation

## 2024-02-08 LAB — POCT URINALYSIS DIPSTICK
Bilirubin, UA: NEGATIVE
Glucose, UA: NEGATIVE
Ketones, UA: NEGATIVE
Nitrite, UA: NEGATIVE
Protein, UA: POSITIVE — AB
Spec Grav, UA: 1.01 (ref 1.010–1.025)
Urobilinogen, UA: 0.2 U/dL
pH, UA: 6.5 (ref 5.0–8.0)

## 2024-02-08 NOTE — Progress Notes (Signed)
 Established patient visit  Patient: Kristine Stanley   DOB: November 06, 2000   23 y.o. Female  MRN: [id number] Visit Date: 02/08/2024  Today's healthcare provider: Jolynn Spencer, PA-C   Chief Complaint  Patient presents with   Acute Visit    Pt reports urinary frequency , urgency and burning. 2 xdays OTC AZO Just gave birth last week   Subjective     HPI     Acute Visit    Additional comments: Pt reports urinary frequency , urgency and burning. 2 xdays OTC AZO Just gave birth last week      Last edited by Wilfred Hargis RAMAN, CMA on 02/08/2024  2:55 PM.       Discussed the use of AI scribe software for clinical note transcription with the patient, who gave verbal consent to proceed.  History of Present Illness Kristine Stanley is a 23 year old female who presents with increased urinary frequency and burning sensation during urination.  She reports 2 to 3 days of increased urinary frequency and burning with urination, described as a burn, without back pain, fever, or flank pain. She delivered last week and has ongoing postpartum bleeding, making it hard for her to tell if she has any vaginal discharge. She is breastfeeding and is concerned about medication safety for her baby. She took two AZO pills last night but stopped.       08/09/2023   10:11 AM 04/28/2023    8:43 AM 04/04/2023    9:39 AM  Depression screen PHQ 2/9  Decreased Interest 0 0 0  Down, Depressed, Hopeless 0 0 0  PHQ - 2 Score 0 0 0  Altered sleeping   0  Tired, decreased energy   0  Change in appetite   0  Feeling bad or failure about yourself    0  Trouble concentrating   0  Moving slowly or fidgety/restless   0  Suicidal thoughts   0  PHQ-9 Score   0      Data saved with a previous flowsheet row definition      08/09/2023   10:11 AM 04/04/2023    9:40 AM  GAD 7 : Generalized Anxiety Score  Nervous, Anxious, on Edge 0 0  Control/stop worrying 0 0  Worry too much - different things 0 0  Trouble  relaxing 0 0  Restless 0 0  Easily annoyed or irritable 0 0  Afraid - awful might happen 0 0  Total GAD 7 Score 0 0  Anxiety Difficulty Not difficult at all Not difficult at all    Medications: Outpatient Medications Prior to Visit  Medication Sig   [DISCONTINUED] Ferrous Sulfate (IRON PO) Take 1 tablet by mouth.   [DISCONTINUED] prenatal vitamin w/FE, FA (NATACHEW) 29-1 MG CHEW chewable tablet Chew 1 tablet by mouth daily at 12 noon.   No facility-administered medications prior to visit.    Review of Systems All negative Except see HPI       Objective    BP 112/84   Pulse 90   Ht 5' (1.524 m)   Wt 165 lb 11.2 oz (75.2 kg)   LMP 04/27/2023   SpO2 98%   Breastfeeding Yes   BMI 32.36 kg/m     Physical Exam Constitutional:      General: She is not in acute distress.    Appearance: Normal appearance.  HENT:     Head: Normocephalic.  Pulmonary:     Effort: Pulmonary effort is normal. No respiratory  distress.  Neurological:     Mental Status: She is alert and oriented to person, place, and time. Mental status is at baseline.      Results for orders placed or performed in visit on 02/08/24  POCT Urinalysis Dipstick  Result Value Ref Range   Color, UA dark yellow    Clarity, UA clear    Glucose, UA Negative Negative   Bilirubin, UA negative    Ketones, UA negative    Spec Grav, UA 1.010 1.010 - 1.025   Blood, UA large (A)    pH, UA 6.5 5.0 - 8.0   Protein, UA Positive (A) Negative   Urobilinogen, UA 0.2 0.2 or 1.0 E.U./dL   Nitrite, UA negative    Leukocytes, UA Moderate (2+) (A) Negative   Appearance     Odor          Assessment & Plan Urinary tract infection with urinary frequency Urinary tract infection with urinary frequency, dysuria, and pain. Recent childbirth complicates assessment of vaginal discharge. Breastfeeding requires consideration of antibiotic safety. Empirical antibiotics discussed but deferred due to breastfeeding and potential need  for culture-guided therapy. AZO not recommended due to lack of data on breastfeeding safety and potential side effects. - Ordered urine culture with susceptibility testing. - Advised to drink plenty of water and cranberry juice. - Instructed to monitor for fever or worsening symptoms and seek urgent care if necessary.  Breastfeeding After delivery last week - Advised that Azo should be avoided during breast-feeding due to potential risk of methemoglobinemia in an infant, especially those less than 1 months old.  Symptoms include cyanosis, respiratory distress, fatigue, confusion, and chocolate-brown blood. Instructed to monitor infant for any adverse effects and consult pediatrician if needed.  Frequent urination (Primary)  - POCT Urinalysis Dipstick - Urine Culture - Urine Microscopic   Orders Placed This Encounter  Procedures   Urine Culture   Urine Microscopic   POCT Urinalysis Dipstick    No follow-ups on file.   The patient was advised to call back or seek an in-person evaluation if the symptoms worsen or if the condition fails to improve as anticipated.  I discussed the assessment and treatment plan with the patient. The patient was provided an opportunity to ask questions and all were answered. The patient agreed with the plan and demonstrated an understanding of the instructions.  I, Norene Oliveri, PA-C have reviewed all documentation for this visit. The documentation on 02/08/2024  for the exam, diagnosis, procedures, and orders are all accurate and complete.  Jolynn Spencer, [facility], MMS [facility] [phone] (phone) [phone] (fax)  [facility]

## 2024-02-08 NOTE — Telephone Encounter (Signed)
 FYI Only or Action Required?: FYI only for provider: appointment scheduled on 12.4.25.  Patient was last seen in primary care on 08/09/2023 by Sharma Coyer, MD.  Called Nurse Triage reporting Urinary Frequency.  Symptoms began yesterday.  Interventions attempted: Rest, hydration, or home remedies.  Symptoms are: unchanged.  Triage Disposition: See Physician Within 24 Hours  Patient/caregiver understands and will follow disposition?: Yes    Copied from CRM #[id number]. Topic: Clinical - Red Word Triage >> Feb 08, 2024  9:34 AM Victoria B wrote: [facility] that prompted transfer to Nurse Triage: Patient has burning with urination and frequent urination Reason for Disposition  Urinating more frequently than usual (i.e., frequency) OR new-onset of the feeling of an urgent need to urinate (i.e., urgency)  Answer Assessment - Initial Assessment Questions Increased frequency, urgency, burning with urination, little bits at time. She states she did just deliver a baby on Wednesday last week but symptoms started 2 days ago. It was a vaginal delivery, she denies any tearing or sutures. Denies fever.    1. SYMPTOM: What's the main symptom you're concerned about? (e.g., frequency, incontinence)     Frequency, urgency burning 2. ONSET: When did the  symptoms  start?     2 days ago 4. CAUSE: What do you think is causing the symptoms?     Pt states feels like previous UTIs 5. OTHER SYMPTOMS: Do you have any other symptoms? (e.g., blood in urine, fever, flank pain, pain with urination)     burning 6. PREGNANCY: Is there any chance you are pregnant? When was your last menstrual period?     Delivered on Wednesday last week.  Protocols used: Urinary Symptoms-A-AH

## 2024-02-09 LAB — URINALYSIS, MICROSCOPIC ONLY
Casts: NONE SEEN /LPF
WBC, UA: >30 /HPF — AB (ref 0–5)

## 2024-02-12 ENCOUNTER — Ambulatory Visit: Payer: Self-pay | Admitting: Physician Assistant

## 2024-02-12 DIAGNOSIS — N390 Urinary tract infection, site not specified: Secondary | ICD-10-CM

## 2024-02-12 LAB — URINE CULTURE

## 2024-02-12 MED ORDER — CEPHALEXIN 500 MG PO CAPS: 500.0000 mg | ORAL_CAPSULE | Freq: Two times a day (BID) | ORAL | 0 refills | Status: AC
